# Patient Record
Sex: Male | Born: 2000 | Race: Black or African American | Hispanic: No | Marital: Single | Smoking: Never smoker
Health system: Southern US, Community
[De-identification: ages and names within clinical notes are randomized; demographics above are authoritative.]

## PROBLEM LIST (undated history)

## (undated) ENCOUNTER — Emergency Department (HOSPITAL_COMMUNITY): Payer: Medicaid Other

## (undated) DIAGNOSIS — R41840 Attention and concentration deficit: Secondary | ICD-10-CM

## (undated) DIAGNOSIS — Q600 Renal agenesis, unilateral: Secondary | ICD-10-CM

## (undated) DIAGNOSIS — J302 Other seasonal allergic rhinitis: Secondary | ICD-10-CM

## (undated) DIAGNOSIS — T7840XA Allergy, unspecified, initial encounter: Secondary | ICD-10-CM

## (undated) DIAGNOSIS — R5600 Simple febrile convulsions: Secondary | ICD-10-CM

## (undated) HISTORY — PX: OTHER SURGICAL HISTORY: SHX169

---
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
## 2018-07-21 ENCOUNTER — Telehealth (INDEPENDENT_AMBULATORY_CARE_PROVIDER_SITE_OTHER): Payer: Self-pay | Admitting: "Endocrinology

## 2018-07-21 NOTE — Telephone Encounter (Signed)
°  Who's calling (name and relationship to patient) :mom-Tiana  Best contact number:[phone]  Provider they see: Fransico Michael  Reason for call: in regards to paperwork for lawyer Jacob Moores, needs paperwork sent Asap, mom states it was dropped off and put with siblings paperwork, gave permission for grandma to recive message     PRESCRIPTION REFILL ONLY  Name of prescription:  Pharmacy:

## 2018-07-21 NOTE — Telephone Encounter (Signed)
Routed to [place].  Apparently some paperwork for this patient was put in his brothers chart but I can't find the brother, Apolinar Junes. I'm confused.

## 2018-08-11 ENCOUNTER — Encounter (INDEPENDENT_AMBULATORY_CARE_PROVIDER_SITE_OTHER): Payer: Self-pay | Admitting: "Endocrinology

## 2018-08-25 ENCOUNTER — Telehealth (INDEPENDENT_AMBULATORY_CARE_PROVIDER_SITE_OTHER): Payer: Self-pay | Admitting: "Endocrinology

## 2018-08-25 NOTE — Telephone Encounter (Signed)
°  Who's calling (name and relationship to patient) :  Best contact number:  Provider they see:  Reason for call: Mom called and needs paperwork, his medical record, forwarded to his attorney, would like it faxed [phone], and the number for the attorney is [phone].     PRESCRIPTION REFILL ONLY  Name of prescription:  Pharmacy:

## 2019-12-16 ENCOUNTER — Other Ambulatory Visit: Payer: Self-pay

## 2019-12-16 ENCOUNTER — Encounter (HOSPITAL_COMMUNITY): Payer: Self-pay | Admitting: Emergency Medicine

## 2019-12-16 ENCOUNTER — Emergency Department (HOSPITAL_COMMUNITY)
Admission: EM | Admit: 2019-12-16 | Discharge: 2019-12-17 | Disposition: A | Discharge status: Home / Self Care | Payer: Self-pay | Attending: Emergency Medicine | Admitting: Emergency Medicine

## 2019-12-16 DIAGNOSIS — R45851 Suicidal ideations: Secondary | ICD-10-CM | POA: Insufficient documentation

## 2019-12-16 DIAGNOSIS — F1220 Cannabis dependence, uncomplicated: Secondary | ICD-10-CM | POA: Insufficient documentation

## 2019-12-16 DIAGNOSIS — Z20822 Contact with and (suspected) exposure to covid-19: Secondary | ICD-10-CM | POA: Insufficient documentation

## 2019-12-16 DIAGNOSIS — F1910 Other psychoactive substance abuse, uncomplicated: Secondary | ICD-10-CM

## 2019-12-16 DIAGNOSIS — F1320 Sedative, hypnotic or anxiolytic dependence, uncomplicated: Secondary | ICD-10-CM | POA: Insufficient documentation

## 2019-12-16 DIAGNOSIS — F1994 Other psychoactive substance use, unspecified with psychoactive substance-induced mood disorder: Secondary | ICD-10-CM | POA: Insufficient documentation

## 2019-12-16 NOTE — ED Triage Notes (Signed)
  Patient comes in with possible drug overdose.  Patient states he took some xanax with friends earlier around 1400 and was doing it to feel good.  Mom states patient has been lethargic all day and slurring his speech.  Incontinent episode earlier.  Patient loud and cursing repeatedly in triage.  Patient states he wants to go home but mom wants him checked out.    Patient states he only took 1 green xanax but family thinks it was more than that.  No alcohol per mom.  Marijuana use.

## 2019-12-17 LAB — COMPREHENSIVE METABOLIC PANEL
ALT: 54 U/L — ABNORMAL HIGH (ref 0–44)
AST: 79 U/L — ABNORMAL HIGH (ref 15–41)
Albumin: 4.8 g/dL (ref 3.5–5.0)
Alkaline Phosphatase: 87 U/L (ref 38–126)
Anion gap: 15 (ref 5–15)
BUN: 5 mg/dL — ABNORMAL LOW (ref 6–20)
CO2: 27 mmol/L (ref 22–32)
Calcium: 10.4 mg/dL — ABNORMAL HIGH (ref 8.9–10.3)
Chloride: 108 mmol/L (ref 98–111)
Creatinine, Ser: 1.53 mg/dL — ABNORMAL HIGH (ref 0.61–1.24)
GFR calc Af Amer: >60 mL/min (ref >60)
GFR calc non Af Amer: >60 mL/min (ref >60)
Glucose, Bld: 130 mg/dL — ABNORMAL HIGH (ref 70–99)
Potassium: 6.2 mmol/L — ABNORMAL HIGH (ref 3.5–5.1)
Sodium: 150 mmol/L — ABNORMAL HIGH (ref 135–145)
Total Bilirubin: 1.2 mg/dL (ref 0.3–1.2)
Total Protein: 8 g/dL (ref 6.5–8.1)

## 2019-12-17 LAB — RAPID URINE DRUG SCREEN, HOSP PERFORMED
Amphetamines: NOT DETECTED
Barbiturates: NOT DETECTED
Benzodiazepines: POSITIVE — AB
Cocaine: NOT DETECTED
Opiates: NOT DETECTED
Tetrahydrocannabinol: POSITIVE — AB

## 2019-12-17 LAB — CBC WITH DIFFERENTIAL/PLATELET
Abs Immature Granulocytes: 0.08 10*3/uL — ABNORMAL HIGH (ref 0.00–0.07)
Basophils Absolute: 0.1 10*3/uL (ref 0.0–0.1)
Basophils Relative: 0 %
Eosinophils Absolute: 0.2 10*3/uL (ref 0.0–0.5)
Eosinophils Relative: 1 %
HCT: 53.6 % — ABNORMAL HIGH (ref 39.0–52.0)
Hemoglobin: 16.7 g/dL (ref 13.0–17.0)
Immature Granulocytes: 0 %
Lymphocytes Relative: 11 %
Lymphs Abs: 2 10*3/uL (ref 0.7–4.0)
MCH: 28.5 pg (ref 26.0–34.0)
MCHC: 31.2 g/dL (ref 30.0–36.0)
MCV: 91.6 fL (ref 80.0–100.0)
Monocytes Absolute: 1 10*3/uL (ref 0.1–1.0)
Monocytes Relative: 6 %
Neutro Abs: 14.7 10*3/uL — ABNORMAL HIGH (ref 1.7–7.7)
Neutrophils Relative %: 82 %
Platelets: 295 10*3/uL (ref 150–400)
RBC: 5.85 MIL/uL — ABNORMAL HIGH (ref 4.22–5.81)
RDW: 12.4 % (ref 11.5–15.5)
WBC: 18 10*3/uL — ABNORMAL HIGH (ref 4.0–10.5)
nRBC: 0 % (ref 0.0–0.2)

## 2019-12-17 LAB — SALICYLATE LEVEL: Salicylate Lvl: <7 mg/dL — ABNORMAL LOW (ref 7.0–30.0)

## 2019-12-17 LAB — URINALYSIS, ROUTINE W REFLEX MICROSCOPIC
Bilirubin Urine: NEGATIVE
Glucose, UA: NEGATIVE mg/dL
Hgb urine dipstick: NEGATIVE
Ketones, ur: NEGATIVE mg/dL
Leukocytes,Ua: NEGATIVE
Nitrite: NEGATIVE
Protein, ur: NEGATIVE mg/dL
Specific Gravity, Urine: 1.003 — ABNORMAL LOW (ref 1.005–1.030)
pH: 5 (ref 5.0–8.0)

## 2019-12-17 LAB — POTASSIUM: Potassium: 3.6 mmol/L (ref 3.5–5.1)

## 2019-12-17 LAB — ETHANOL: Alcohol, Ethyl (B): <10 mg/dL (ref <10)

## 2019-12-17 LAB — RESPIRATORY PANEL BY RT PCR (FLU A&B, COVID)
Influenza A by PCR: NEGATIVE
Influenza B by PCR: NEGATIVE
SARS Coronavirus 2 by RT PCR: NEGATIVE

## 2019-12-17 LAB — ACETAMINOPHEN LEVEL: Acetaminophen (Tylenol), Serum: <10 ug/mL — ABNORMAL LOW (ref 10–30)

## 2019-12-17 LAB — CBG MONITORING, ED: Glucose-Capillary: 99 mg/dL (ref 70–99)

## 2019-12-17 MED ORDER — SODIUM CHLORIDE 0.9 % IV BOLUS
1000.0000 mL | Freq: Once | INTRAVENOUS | Status: AC
  Administered 2019-12-17: 1000 mL via INTRAVENOUS

## 2019-12-17 MED ORDER — ZIPRASIDONE MESYLATE 20 MG IM SOLR
20.0000 mg | Freq: Once | INTRAMUSCULAR | Status: AC
  Administered 2019-12-17: 20 mg via INTRAMUSCULAR

## 2019-12-17 MED ORDER — STERILE WATER FOR INJECTION IJ SOLN
10.0000 mL | Freq: Once | INTRAMUSCULAR | Status: AC
  Administered 2019-12-17: 10 mL via INTRAMUSCULAR

## 2019-12-17 MED ORDER — ZIPRASIDONE MESYLATE 20 MG IM SOLR
INTRAMUSCULAR | Status: AC
  Filled 2019-12-17: qty 20

## 2019-12-17 NOTE — Consult Note (Signed)
Telepsych Consultation   Reason for Consult:  Aggressive Behavioral  Referring Physician: EDP  Location of Patient:  50[address] Location of Provider: [facility]  Patient Identification: Ronnie Davis MRN:  [id number] Principal Diagnosis: <principal problem not specified> Diagnosis:  Active Problems:   * No active hospital problems. *   Total Time spent with patient: 15 minutes  Subjective:   Ronnie Davis is a 19 y.o. male patient was seen and evaluated via teleassessment.  He denies suicidal or homicidal ideations.  Denies auditory or visual hallucinations.  Denied that he has been followed by therapist and/or psychiatrist in the past.  Patient admits to using substances occasionally.  Patient declined outpatient resources to help with substance use.  Denied previous inpatient admissions.  Denies that he is taking medications daily for mental health.  Denies family history of mental health.  Patient provided verbal authorization to follow-up with patient's grandmother.  Spoke to Ronnie Davis who denied any safety concerns.  Denies any guns or weapons in the home.  States this is the first she is hard of patient being belligerent and depressed.  Patient was provided with additional outpatient resources.  Support, encouragement and  reassurance was provided.  HPI:  Per admission assessment note: Ronnie Davis is an 19 y.o. male who presents to the ED under IVC initiated by EDP. Per IVC, respondent "Ronnie Davis was brought to the Emergency Department by his mother for evaluation of aggressive and violent behavior. He told her that he took Xanax to "feel good", has told others that he he "has nothing to live for." He is very uncooperative, combative." Pt continues to be unresponsive during the assessment. Pt has his blanket covering his face and does not show his face during the entire assessment. Pt admits he intentionally ingested xanax "because I like how it makes me feel." Pt states he  is not prescribed the medication, but he received it from someone. Pt admits he uses cannabis daily. TTS asked the pt if he is suicidal and he states "fuck no." Pt also denies HI and denies AVH. Pt states he just started to work 2 days ago at a location on [employer] but it is unclear of the location because the pt is mumbled and the blanket covers his face. Pt states "I don't sleep."    Pt states he has pending legal charges and when asked what the charges are pt yells "I don't know. Ask my mom!" TTS informed the pt his mother would be called in order to obtain collateral information with his consent and then pt exclaims "where is my mother?!" Pt states he does not know his mothers telephone number. TTS contacted the telephone number entered in the pt's chart, (Ronnie Davis,Ronnie Davis) [phone] but did not receive an answer. There is no v/m for TTS to leave a message.  Pt is aggressive in the ED. He is alert and oriented to self and place only. Pt appears irritable and uncertain why he is in the ED. Pt keeps the blanket over his face during the assessment. Pt denies prior inpt admission. Pt reports he has received OPT in the past but he does not recall when or why he was being treated  Past Psychiatric History:   Risk to Self: Suicidal Ideation: No Suicidal Intent: No Is patient at risk for suicide?: No Suicidal Plan?: No Access to Means: No What has been your use of drugs/alcohol within the last 12 months?: xanax, cannabis Triggers for Past Attempts: None known Intentional Self Injurious  Behavior: None Risk to Others: Homicidal Ideation: No Thoughts of Harm to Others: No Current Homicidal Intent: No Current Homicidal Plan: No Access to Homicidal Means: No History of harm to others?: Yes Assessment of Violence: On admission Violent Behavior Description: pt aggressive and violent in the ED Does patient have access to weapons?: No Criminal Charges Pending?: Yes Describe Pending Criminal Charges:  (unk) Does patient have a court date: Yes Court Date: (unk) Prior Inpatient Therapy: Prior Inpatient Therapy: No Prior Outpatient Therapy: Prior Outpatient Therapy: Yes Prior Therapy Dates: pt does not recall Prior Therapy Facilty/Provider(s): pt does not recall Reason for Treatment: pt does not recall Does patient have an ACCT team?: No Does patient have Intensive In-House Services?  : No Does patient have [facility] services? : No Does patient have P4CC services?: No  Past Medical History: History reviewed. No pertinent past medical history.  Family History: History reviewed. No pertinent family history. Family Psychiatric  History:  Social History:  Social History   Substance and Sexual Activity  Alcohol Use None     Social History   Substance and Sexual Activity  Drug Use Not on file    Social History   Socioeconomic History  . Marital status: Single    Spouse name: Not on file  . Number of children: Not on file  . Years of education: Not on file  . Highest education level: Not on file  Occupational History  . Not on file  Tobacco Use  . Smoking status: Not on file  Substance and Sexual Activity  . Alcohol use: Not on file  . Drug use: Not on file  . Sexual activity: Not on file  Other Topics Concern  . Not on file  Social History Narrative  . Not on file   Social Determinants of Health   Financial Resource Strain:   . Difficulty of Paying Living Expenses:   Food Insecurity:   . Worried About Programme researcher, broadcasting/film/video in the Last Year:   . Barista in the Last Year:   Transportation Needs:   . Freight forwarder (Medical):   Marland Kitchen Lack of Transportation (Non-Medical):   Physical Activity:   . Days of Exercise per Week:   . Minutes of Exercise per Session:   Stress:   . Feeling of Stress :   Social Connections:   . Frequency of Communication with Friends and Family:   . Frequency of Social Gatherings with Friends and Family:   . Attends Religious  Services:   . Active Member of Clubs or Organizations:   . Attends Banker Meetings:   Marland Kitchen Marital Status:    Additional Social History:    Allergies:  No Known Allergies  Labs:  Results for orders placed or performed during the hospital encounter of 12/16/19 (from the past 48 hour(s))  Comprehensive metabolic panel     Status: Abnormal   Collection Time: 12/17/19 12:17 AM  Result Value Ref Range   Sodium 150 (H) 135 - 145 mmol/L   Potassium 6.2 (H) 3.5 - 5.1 mmol/L   Chloride 108 98 - 111 mmol/L   CO2 27 22 - 32 mmol/L   Glucose, Bld 130 (H) 70 - 99 mg/dL    Comment: Glucose reference range applies only to samples taken after fasting for at least 8 hours.   BUN 5 (L) 6 - 20 mg/dL   Creatinine, Ser 0.93 (H) 0.61 - 1.24 mg/dL   Calcium 23.5 (H) 8.9 - 10.3  mg/dL   Total Protein 8.0 6.5 - 8.1 g/dL   Albumin 4.8 3.5 - 5.0 g/dL   AST 79 (H) 15 - 41 U/L   ALT 54 (H) 0 - 44 U/L   Alkaline Phosphatase 87 38 - 126 U/L   Total Bilirubin 1.2 0.3 - 1.2 mg/dL   GFR calc non Af Amer >60 >60 mL/min   GFR calc Af Amer >60 >60 mL/min   Anion gap 15 5 - 15    Comment: Performed at [facility] [address] [place], [place] [address]  Salicylate level     Status: Abnormal   Collection Time: 12/17/19 12:17 AM  Result Value Ref Range   Salicylate Lvl <6.7 (L) 7.0 - 30.0 mg/dL    Comment: Performed at [facility] [address], [place], [place] [address]  Acetaminophen level     Status: Abnormal   Collection Time: 12/17/19 12:17 AM  Result Value Ref Range   Acetaminophen (Tylenol), Serum <10 (L) 10 - 30 ug/mL    Comment: (NOTE) Therapeutic concentrations vary significantly. A range of 10-30 ug/mL  may be an effective concentration for many patients. However, some  are best treated at concentrations outside of this range. Acetaminophen concentrations >150 ug/mL at 4 hours after ingestion  and >50 ug/mL at 12 hours after ingestion are often associated with   toxic reactions. Performed at [facility], [place] [address] [place], [place] [address]   Ethanol     Status: None   Collection Time: 12/17/19 12:17 AM  Result Value Ref Range   Alcohol, Ethyl (B) <10 <10 mg/dL    Comment: (NOTE) Lowest detectable limit for serum alcohol is 10 mg/dL. For medical purposes only. Performed at [facility], [place] [address] [place], [place] [address]   CBC WITH DIFFERENTIAL     Status: Abnormal   Collection Time: 12/17/19 12:17 AM  Result Value Ref Range   WBC 18.0 (H) 4.0 - 10.5 K/uL   RBC 5.85 (H) 4.22 - 5.81 MIL/uL   Hemoglobin 16.7 13.0 - 17.0 g/dL   HCT 53.6 (H) 39.0 - 52.0 %   MCV 91.6 80.0 - 100.0 fL   MCH 28.5 26.0 - 34.0 pg   MCHC 31.2 30.0 - 36.0 g/dL   RDW 12.4 11.5 - 15.5 %   Platelets 295 150 - 400 K/uL   nRBC 0.0 0.0 - 0.2 %   Neutrophils Relative % 82 %   Neutro Abs 14.7 (H) 1.7 - 7.7 K/uL   Lymphocytes Relative 11 %   Lymphs Abs 2.0 0.7 - 4.0 K/uL   Monocytes Relative 6 %   Monocytes Absolute 1.0 0.1 - 1.0 K/uL   Eosinophils Relative 1 %   Eosinophils Absolute 0.2 0.0 - 0.5 K/uL   Basophils Relative 0 %   Basophils Absolute 0.1 0.0 - 0.1 K/uL   Immature Granulocytes 0 %   Abs Immature Granulocytes 0.08 (H) 0.00 - 0.07 K/uL    Comment: Performed at [facility], [address] [place], [place] [address]  Urinalysis, Routine w reflex microscopic     Status: Abnormal   Collection Time: 12/17/19 12:20 AM  Result Value Ref Range   Color, Urine STRAW (A) YELLOW   APPearance CLEAR CLEAR   Specific Gravity, Urine 1.003 (L) 1.005 - 1.030   pH 5.0 5.0 - 8.0   Glucose, UA NEGATIVE NEGATIVE mg/dL   Hgb urine dipstick NEGATIVE NEGATIVE   Bilirubin Urine  NEGATIVE NEGATIVE   Ketones, ur NEGATIVE NEGATIVE mg/dL   Protein, ur NEGATIVE NEGATIVE mg/dL   Nitrite NEGATIVE NEGATIVE   Leukocytes,Ua NEGATIVE NEGATIVE    Comment: Performed at [facility], [address] [place], [place] [address]  Potassium      Status: None   Collection Time: 12/17/19  1:11 AM  Result Value Ref Range   Potassium 3.6 3.5 - 5.1 mmol/L    Comment: NO VISIBLE HEMOLYSIS Performed at [facility], [address], [place], [place] [address]   CBG monitoring, ED     Status: None   Collection Time: 12/17/19  1:25 AM  Result Value Ref Range   Glucose-Capillary 99 70 - 99 mg/dL    Comment: Glucose reference range applies only to samples taken after fasting for at least 8 hours.  Urine rapid drug screen (hosp performed)     Status: Abnormal   Collection Time: 12/17/19  2:14 AM  Result Value Ref Range   Opiates NONE DETECTED NONE DETECTED   Cocaine NONE DETECTED NONE DETECTED   Benzodiazepines POSITIVE (A) NONE DETECTED   Amphetamines NONE DETECTED NONE DETECTED   Tetrahydrocannabinol POSITIVE (A) NONE DETECTED   Barbiturates NONE DETECTED NONE DETECTED    Comment: (NOTE) DRUG SCREEN FOR MEDICAL PURPOSES ONLY.  IF CONFIRMATION IS NEEDED FOR ANY PURPOSE, NOTIFY LAB WITHIN 5 DAYS. LOWEST DETECTABLE LIMITS FOR URINE DRUG SCREEN Drug Class                     Cutoff (ng/mL) Amphetamine and metabolites    1000 Barbiturate and metabolites    200 Benzodiazepine                 200 Tricyclics and metabolites     300 Opiates and metabolites        300 Cocaine and metabolites        300 THC                            50 Performed at [facility], [address] [place], [place] [address]   Respiratory Panel by RT PCR (Flu A&B, Covid) - Nasopharyngeal Swab     Status: None   Collection Time: 12/17/19  6:00 AM   Specimen: Nasopharyngeal Swab  Result Value Ref Range   SARS Coronavirus 2 by RT PCR NEGATIVE NEGATIVE    Comment: (NOTE) SARS-CoV-2 target nucleic acids are NOT DETECTED. The SARS-CoV-2 RNA is generally detectable in upper respiratoy specimens during the acute phase of infection. The lowest concentration of SARS-CoV-2 viral copies this assay can detect is 131 copies/mL. A negative result does  not preclude SARS-Cov-2 infection and should not be used as the sole basis for treatment or other patient management decisions. A negative result may occur with  improper specimen collection/handling, submission of specimen other than nasopharyngeal swab, presence of viral mutation(s) within the areas targeted by this assay, and inadequate number of viral copies (<131 copies/mL). A negative result must be combined with clinical observations, patient history, and epidemiological information. The expected result is Negative. Fact Sheet for Patients:  https://www.moore.com/ Fact Sheet for Healthcare Providers:  https://www.young.biz/ This test is not yet ap proved or cleared by the [place] FDA and  has been authorized for detection and/or diagnosis of SARS-CoV-2 by FDA under an Emergency Use Authorization (EUA). This EUA will remain  in effect (meaning this test can be used) for  the duration of the COVID-19 declaration under Section 564(b)(1) of the Act, 21 U.S.C. section 360bbb-3(b)(1), unless the authorization is terminated or revoked sooner.    Influenza A by PCR NEGATIVE NEGATIVE   Influenza B by PCR NEGATIVE NEGATIVE    Comment: (NOTE) The Xpert Xpress SARS-CoV-2/FLU/RSV assay is intended as an aid in  the diagnosis of influenza from Nasopharyngeal swab specimens and  should not be used as a sole basis for treatment. Nasal washings and  aspirates are unacceptable for Xpert Xpress SARS-CoV-2/FLU/RSV  testing. Fact Sheet for Patients: https://www.moore.com/https://www.fda.gov/media/142436/download Fact Sheet for Healthcare Providers: https://www.young.biz/https://www.fda.gov/media/142435/download This test is not yet approved or cleared by the [place] FDA and  has been authorized for detection and/or diagnosis of SARS-CoV-2 by  FDA under an Emergency Use Authorization (EUA). This EUA will remain  in effect (meaning this test can be used) for the duration of the  Covid-19  declaration under Section 564(b)(1) of the Act, 21  U.S.C. section 360bbb-3(b)(1), unless the authorization is  terminated or revoked. Performed at [facility], [address] [place], [place] [address]     Medications:  No current facility-administered medications for this encounter.   No current outpatient medications on file.    Musculoskeletal:   Psychiatric Specialty Exam: Physical Exam  Review of Systems  Blood pressure (!) 122/98, pulse 72, temperature 98.5 F (36.9 C), temperature source Oral, resp. rate 16, height 5\' 7"  (1.702 m), weight 50 kg, SpO2 99 %.Body mass index is 17.26 kg/m.  General Appearance: Casual  Eye Contact:  Good  Speech:  Clear and Coherent  Volume:  Normal  Mood:  Anxious and Depressed  Affect:  Congruent  Thought Process:  Coherent  Orientation:  Full (Time, Place, and Person)  Thought Content:  Logical  Suicidal Thoughts:  No  Homicidal Thoughts:  No  Memory:  Immediate;   Fair Recent;   Fair  Judgement:  Fair  Insight:  Fair  Psychomotor Activity:  Normal  Concentration:  Concentration: Fair  Recall:  Fis[employer]Fair  Fund of Knowledge:  Fair  Language:  Fair  Akathisia:  No  Handed:  Right  AIMS (if indicated):     Assets:  Communication Skills Desire for Improvement Resilience Social Support  ADL's:  Intact  Cognition:  WNL  Sleep:      Patient to be cleared by psychiatry CSW to provide additional outpatient resources for substances abuse and depresison  Disposition: No evidence of imminent risk to self or others at present.   Recommend psychiatric Inpatient admission when medically cleared. Supportive therapy provided about ongoing stressors. Refer to IOP. Discussed crisis plan, support from social network, calling 911, coming to the Emergency Department, and calling Suicide Hotline.  This service was provided via telemedicine using a 2-way, interactive audio and video technology.  Names of all persons participating in  this telemedicine service and their role in this encounter. Name: Shea StakesBryson Propst  Role: patient   Name: T.Daylynn Stumpp Role: NP           Oneta Rackanika N Eleri Ruben, NP 12/17/2019 11:03 AM

## 2019-12-17 NOTE — Discharge Instructions (Addendum)
Follow up with mental health as recommended by the psychiatry team, please review the instructions to help arranging for outpatient treatment  Follow up with a primary care doctor for a checkup and to recheck your laboratory tests

## 2019-12-17 NOTE — ED Notes (Signed)
Contacted patient placement for sitter. Informed by Aggie Hacker that a sitter will be available at [facility]

## 2019-12-17 NOTE — Progress Notes (Signed)
TTS was able to review pt's chart and IVC. Spoke with Margarette Canada, RN to advise cart can be placed in pt's room for assessment.

## 2019-12-17 NOTE — B[facility] Counselor (Signed)
Clinician called and spoke to Will, Charity fundraiser. Per Will, RN pt was violent in the front and was given Geodon. Pt is currently sleep. Clinician asked Will, RN to call once pt was alert, oriented and able to engage.    Redmond Pulling, MS, [facility], [facility] Triage Specialist [phone].

## 2019-12-17 NOTE — ED Notes (Addendum)
Pt arrived to Rm 51 via stretcher - pt refused to ambulate to bed - Pt noted to be cursing, verbally abusive, and talking loudly - inappropriate language. Pt noted to be dressed in scrubs. Sitter w/pt.

## 2019-12-17 NOTE — ED Provider Notes (Signed)
[facility] EMERGENCY DEPARTMENT Provider Note   CSN: [id number] Arrival date & time: 12/16/19  2342     History Chief Complaint  Patient presents with  . Drug Overdose    Ronnie [place] is a 19 y.o. male.  Patient is a 19 year old male otherwise healthy brought by mom for evaluation of possible drug ingestion.  Patient has been apparently stumbling around and behaving inappropriately.  His speech has been slurred.  Patient does admit to taking Xanax earlier today so that he would "feel good".  He has told those attempting to take care of him here in the ER that he "has nothing to live for".  Patient is extremely uncooperative and adds little to no history.  He is cursing and swearing and requiring physical and chemical restraint.  The history is provided by the patient and a relative (Mother).       History reviewed. No pertinent past medical history.  There are no problems to display for this patient.        History reviewed. No pertinent family history.  Social History   Tobacco Use  . Smoking status: Not on file  Substance Use Topics  . Alcohol use: Not on file  . Drug use: Not on file    Home Medications Prior to Admission medications   Not on File    Allergies    Patient has no known allergies.  Review of Systems   Review of Systems  Unable to perform ROS: Psychiatric disorder    Physical Exam Updated Vital Signs BP (!) 139/93 (BP Location: Left Arm)   Pulse 92   Temp 98.5 F (36.9 C) (Oral)   Resp 20   Ht 5\' 7"  (1.702 m)   Wt 50 kg   SpO2 96%   BMI 17.26 kg/m   Physical Exam Vitals and nursing note reviewed.  Constitutional:      General: He is not in acute distress.    Appearance: He is well-developed. He is not diaphoretic.     Comments: Patient is a 19 year old male.  He is extremely uncooperative and combative.  He is screaming and cursing and requiring physical and chemical restraint.  HENT:     Head:  Normocephalic and atraumatic.  Cardiovascular:     Rate and Rhythm: Normal rate and regular rhythm.     Heart sounds: No murmur. No friction rub.  Pulmonary:     Effort: Pulmonary effort is normal. No respiratory distress.     Breath sounds: Normal breath sounds. No wheezing or rales.  Abdominal:     General: Bowel sounds are normal. There is no distension.     Palpations: Abdomen is soft.     Tenderness: There is no abdominal tenderness.  Musculoskeletal:        General: Normal range of motion.     Cervical back: Normal range of motion and neck supple.  Skin:    General: Skin is warm and dry.  Neurological:     Mental Status: He is alert and oriented to person, place, and time.     Coordination: Coordination normal.  Psychiatric:        Mood and Affect: Affect is angry.        Behavior: Behavior is agitated, aggressive and combative.        Thought Content: Thought content includes suicidal ideation.     ED Results / Procedures / Treatments   Labs (all labs ordered are listed, but only abnormal results are displayed)  Labs Reviewed  CBC WITH DIFFERENTIAL/PLATELET - Abnormal; Notable for the following components:      Result Value   WBC 18.0 (*)    RBC 5.85 (*)    HCT 53.6 (*)    Neutro Abs 14.7 (*)    Abs Immature Granulocytes 0.08 (*)    All other components within normal limits  COMPREHENSIVE METABOLIC PANEL  SALICYLATE LEVEL  ACETAMINOPHEN LEVEL  ETHANOL  RAPID URINE DRUG SCREEN, HOSP PERFORMED  URINALYSIS, ROUTINE W REFLEX MICROSCOPIC  CBG MONITORING, ED    EKG EKG Interpretation  Date/Time:  Saturday December 17 2019 00:42:14 EDT Ventricular Rate:  89 PR Interval:    QRS Duration: 83 QT Interval:  341 QTC Calculation: 415 R Axis:   129 Text Interpretation: Sinus rhythm Short PR interval Right atrial enlargement Right axis deviation Confirmed by Veryl Speak [phone]) on 12/17/2019 12:47:56 AM   Radiology No results found.  Procedures Procedures  (including critical care time)  Medications Ordered in ED Medications  sodium chloride 0.9 % bolus 1,000 mL (has no administration in time range)  ziprasidone (GEODON) injection 20 mg (20 mg Intramuscular Given 12/17/19 0017)  sterile water (preservative free) injection 10 mL (10 mLs Injection Given 12/17/19 0017)    ED Course  I have reviewed the triage vital signs and the nursing notes.  Pertinent labs & imaging results that were available during my care of the patient were reviewed by me and considered in my medical decision making (see chart for details).    MDM Rules/Calculators/A&P  Patient was brought here by his mother for evaluation of possible substance ingestion.  Patient describes taking a friend's Xanax to try to get high.  There are also reports that he took multiple pills and that he made remarks about having nothing to live for and threatening to harm himself.  Patient arrived here extremely uncooperative and combative.  Patient required both physical and chemical restraint.  He was given Geodon as he was extremely violent in triage.  As this medication took effect, the patient became more somnolent and work-up was able to be initiated.  His laboratory studies show no significant blood or electrolyte disturbance.  His Tylenol and salicylate levels are negative.  Drug screen does show benzodiazepines and marijuana.  Patient in the process of sleeping off the Geodon and what other medications he took prior to coming in.  TTS attempted to speak with him, however he remains either somnolent or uncooperative.  An attempt at formal psychiatric evaluation will be made in the morning.  Care will be signed out to the oncoming provider at shift change.  Final Clinical Impression(s) / ED Diagnoses Final diagnoses:  None    Rx / DC Orders ED Discharge Orders    None       Veryl Speak, MD 12/17/19 [phone]

## 2019-12-17 NOTE — ED Notes (Signed)
IVC paperwork rescinded by Dr Lynelle Doctor - Copy faxed to [facility] - Copy sent to Medical Records - Original placed in folder for [employer].

## 2019-12-17 NOTE — BH Assessment (Signed)
Tele Assessment Note   Patient Name: Ronnie Davis MRN: [id number] Referring Physician: Geoffery Lyons, MD Location of Patient: [facility] Location of Provider: Behavioral Health TTS Department  Ronnie Davis is an 19 y.o. male who presents to the ED under IVC initiated by EDP. Per IVC, respondent "Brittan was brought to the Emergency Department by his mother for evaluation of aggressive and violent behavior. He told her that he took Xanax to "feel good", has told others that he he "has nothing to live for." He is very uncooperative, combative." Pt continues to be unresponsive during the assessment. Pt has his blanket covering his face and does not show his face during the entire assessment. Pt admits he intentionally ingested xanax "because I like how it makes me feel." Pt states he is not prescribed the medication, but he received it from someone. Pt admits he uses cannabis daily. TTS asked the pt if he is suicidal and he states "fuck no." Pt also denies HI and denies AVH. Pt states he just started to work 2 days ago at a location on [employer] but it is unclear of the location because the pt is mumbled and the blanket covers his face. Pt states "I don't sleep."    Pt states he has pending legal charges and when asked what the charges are pt yells "I don't know. Ask my mom!" TTS informed the pt his mother would be called in order to obtain collateral information with his consent and then pt exclaims "where is my mother?!" Pt states he does not know his mothers telephone number. TTS contacted the telephone number entered in the pt's chart, (Davis,Ronnie) [phone] but did not receive an answer. There is no v/m for TTS to leave a message.  Pt is aggressive in the ED. He is alert and oriented to self and place only. Pt appears irritable and uncertain why he is in the ED. Pt keeps the blanket over his face during the assessment. Pt denies prior inpt admission. Pt reports he has received OPT in the past but  he does not recall when or why he was being treated.  Ronnie Murdoch, NP recommends continued observation for safety and stabilization and to be reassessed by psych once he is sober. EDP Geoffery Lyons, MD and Margarette Canada, RN have been advised of the disposition.  Diagnosis: Substance induced mood d/o; Sedative use d/o, severe; Cannabis use d/o, severe  Past Medical History: History reviewed. No pertinent past medical history.   Family History: History reviewed. No pertinent family history.  Social History:  has no history on file for tobacco, alcohol, and drug.  Additional Social History:  Alcohol / Drug Use Pain Medications: See MAR Prescriptions: See MAR Over the Counter: See MAR History of alcohol / drug use?: Yes Substance #1 Name of Substance 1: Xanax 1 - Age of First Use: 18 1 - Amount (size/oz): unk 1 - Frequency: once 1 - Duration: ongoing 1 - Last Use / Amount: 12/17/2019 Substance #2 Name of Substance 2: Marijuana 2 - Age of First Use: teens 2 - Amount (size/oz): excessive 2 - Frequency: daily 2 - Duration: ongoing 2 - Last Use / Amount: 12/16/2019  CIWA: CIWA-Ar BP: (!) 122/98 Pulse Rate: 72 COWS:    Allergies: No Known Allergies  Home Medications: (Not in a hospital admission)   OB/GYN Status:  No LMP for male patient.  General Assessment Data Assessment unable to be completed: Yes Reason for not completing assessment: Clinician called and spoke to Will,  RN. Per Will, RN pt was violent in the front and was given Geodon. Pt is currently sleep. Clinician asked Will, RN to call once pt was alert, oriented and able to engage.  Location of Assessment: [facility] ED TTS Assessment: In system Is this a Tele or Face-to-Face Assessment?: Tele Assessment Is this an Initial Assessment or a Re-assessment for this encounter?: Initial Assessment Patient Accompanied by:: N/A Language Other than English: No Living Arrangements: Other (Comment) What gender do  you identify as?: Male Marital status: Single Pregnancy Status: No Living Arrangements: Other relatives(cousin, mother) Can pt return to current living arrangement?: Yes Admission Status: Involuntary Petitioner: ED Attending Is patient capable of signing voluntary admission?: No Referral Source: Self/Family/Friend Insurance type: none     Crisis Care Plan Living Arrangements: Other relatives(cousin, mother) Name of Psychiatrist: none Name of Therapist: none  Education Status Is patient currently in school?: No Is the patient employed, unemployed or receiving disability?: Employed  Risk to self with the past 6 months Suicidal Ideation: No Has patient been a risk to self within the past 6 months prior to admission? : No Suicidal Intent: No Has patient had any suicidal intent within the past 6 months prior to admission? : No Is patient at risk for suicide?: No Suicidal Plan?: No Has patient had any suicidal plan within the past 6 months prior to admission? : No Access to Means: No What has been your use of drugs/alcohol within the last 12 months?: xanax, cannabis Previous Attempts/Gestures: No Triggers for Past Attempts: None known Intentional Self Injurious Behavior: None Family Suicide History: No Recent stressful life event(s): Other (Comment)(substance abuse) Persecutory voices/beliefs?: No Depression: No Substance abuse history and/or treatment for substance abuse?: Yes Suicide prevention information given to non-admitted patients: Not applicable  Risk to Others within the past 6 months Homicidal Ideation: No Does patient have any lifetime risk of violence toward others beyond the six months prior to admission? : Yes (comment)(pt aggressive in ED) Thoughts of Harm to Others: No Current Homicidal Intent: No Current Homicidal Plan: No Access to Homicidal Means: No History of harm to others?: Yes Assessment of Violence: On admission Violent Behavior Description: pt  aggressive and violent in the ED Does patient have access to weapons?: No Criminal Charges Pending?: Yes Describe Pending Criminal Charges: (unk) Does patient have a court date: Yes Court Date: (unk) Is patient on probation?: Unknown  Psychosis Hallucinations: None noted Delusions: None noted  Mental Status Report Appearance/Hygiene: Disheveled, Bizarre Eye Contact: Poor Motor Activity: Unremarkable Speech: Aggressive, Slurred Level of Consciousness: Alert, Irritable Mood: Angry, Preoccupied Affect: Angry, Irritable Anxiety Level: None Thought Processes: Irrelevant Judgement: Impaired Orientation: Person, Place Obsessive Compulsive Thoughts/Behaviors: None  Cognitive Functioning Concentration: Normal Memory: Recent Intact, Remote Impaired Is patient IDD: No Insight: Poor Impulse Control: Poor Appetite: Good Have you had any weight changes? : No Change Sleep: Decreased Total Hours of Sleep: 4 Vegetative Symptoms: None  ADLScreening [facility] Assessment Services) Patient's cognitive ability adequate to safely complete daily activities?: Yes Patient able to express need for assistance with ADLs?: Yes Independently performs ADLs?: Yes (appropriate for developmental age)  Prior Inpatient Therapy Prior Inpatient Therapy: No  Prior Outpatient Therapy Prior Outpatient Therapy: Yes Prior Therapy Dates: pt does not recall Prior Therapy Facilty/Provider(s): pt does not recall Reason for Treatment: pt does not recall Does patient have an ACCT team?: No Does patient have Intensive In-House Services?  : No Does patient have [facility] services? : No Does patient have P4CC services?: No  ADL Screening (condition at time of admission) Patient's cognitive ability adequate to safely complete daily activities?: Yes Is the patient deaf or have difficulty hearing?: No Does the patient have difficulty seeing, even when wearing glasses/contacts?: No Does the patient have difficulty  concentrating, remembering, or making decisions?: No Patient able to express need for assistance with ADLs?: Yes Does the patient have difficulty dressing or bathing?: No Independently performs ADLs?: Yes (appropriate for developmental age) Does the patient have difficulty walking or climbing stairs?: No Weakness of Legs: None Weakness of Arms/Hands: None  Home Assistive Devices/Equipment Home Assistive Devices/Equipment: None    Abuse/Neglect Assessment (Assessment to be complete while patient is alone) Abuse/Neglect Assessment Can Be Completed: Yes Physical Abuse: Denies Verbal Abuse: Denies Sexual Abuse: Denies Exploitation of patient/patient's resources: Denies Self-Neglect: Denies     Regulatory affairs officer (For Healthcare) Does Patient Have a Medical Advance Directive?: No Would patient like information on creating a medical advance directive?: No - Patient declined          Disposition: Caroline Sauger, NP recommends continued observation for safety and stabilization and to be reassessed by psych once he is sober. EDP Veryl Speak, MD and Wynn Maudlin, RN have been advised of the disposition.  Disposition Initial Assessment Completed for this Encounter: Yes Disposition of Patient: (overnight OBS pending AM psych assessment) Patient refused recommended treatment: No  This service was provided via telemedicine using a 2-way, interactive audio and video technology.  Names of all persons participating in this telemedicine service and their role in this encounter. Name: [facility] Role: Patient  Name: Caroline Sauger, NP Role: [facility] Provider  Name: Lind Covert, LCSW Role: TTS       Lyanne Co 12/17/2019 6:53 AM

## 2019-12-17 NOTE — ED Notes (Signed)
IVC papers - Copy faxed to [facility] - Copy sent to Medical Records - ALL 3 set on clipboard.

## 2019-12-17 NOTE — Progress Notes (Signed)
Gillermo Murdoch, NP recommends continued observation for safety and stabilization and to be reassessed by psych once he is sober. EDP Geoffery Lyons, MD and Margarette Canada, RN have been advised of the disposition.

## 2019-12-17 NOTE — Progress Notes (Signed)
Margarette Canada, RN called on telepsych to say pt is ready to be seen. Advised him TTS is unfamiliar with this pt as he was being reviewed by another counselor and I have not opened this pt's chart prior to Margarette Canada, RN calling, therefore I do not know anything about this pt.

## 2019-12-17 NOTE — ED Notes (Signed)
Telepsych being performed. 

## 2019-12-17 NOTE — ED Notes (Signed)
Pt refused clean scrubs, pt choosing to wear soiled scrub pt states "I don't care I don't want to be messed with right now". RN notified.

## 2019-12-17 NOTE — ED Notes (Addendum)
   Patient very loud and verbally abusive to staff.  Patient standing in corner of the room and threatening to fight staff and states he is gonna come back and shoot [place] tomorrow.  Excessively cursing and using racial slurs to [employer] and other staff.  Mother in room with patient laughing and minimally trying to stop patient from acting out.    [employer] and [facility] security at bedside to restrain patient.  Ivar Drape PA states patient will be made an IVC.  Patient states "im gonna call my boys and they gonna come shoot everyone of yall".  Patient kicking and swinging at staff during lab draw.  IM Geodon given to patient for agitation.  Patient taken to room 21 by security on stretcher.   Note written by Ivonne Andrew RN

## 2019-12-17 NOTE — ED Notes (Signed)
Pt's grandmother, Venetia Maxon, called inquiring about pt - Woke pt - Pt gave verbal permission to advise her of tx plan - waiting for [facility].

## 2019-12-17 NOTE — ED Provider Notes (Addendum)
Pt assessed by psychiatry.  Pt is now calm and cooperative morning.  Felt to be stable for discharge, outpatient treatment.  Recc outpt follow up to recheck renal function labs  Linwood Dibbles, MD 12/17/19 1128    Linwood Dibbles, MD 12/17/19 [phone]

## 2019-12-17 NOTE — ED Notes (Signed)
Pt metel detected by security.

## 2019-12-17 NOTE — ED Notes (Addendum)
Pt now awake - ambulated to bathroom and back to room w/o difficulty. Pt closed door to room. Stood at door - looking through glass - yelling "What the fuck am I doing here?" RN updated pt and advised him of tx plan - BH NP to assess. BH SW to advise [facility] NP. Pt denies SI/HI. States "I just want to go the fuck home!". Offered breakfast tray to pt - declined at this time. TV remote given to pt as requested.

## 2019-12-17 NOTE — ED Notes (Signed)
BH NP advised recommends for pt to be d/c'd. Pt aware.

## 2019-12-17 NOTE — ED Notes (Signed)
ALL Belongings - 1 labeled belongings bag - returned to pt - Pt signed verifying all items present. Pt's mother came to lobby advising is here to pick up pt. D/C instructions given and questions answered to satisfaction along w/resources.

## 2019-12-17 NOTE — Progress Notes (Signed)
CSW faxed outpatient resources for pt to [facility] ED and notified Kriste Basque, [place].    Ruthann Cancer MSW, LCSWA Clincal Social Worker Disposition  [facility] Ph: [phone] Fax: [phone]

## 2021-01-04 DIAGNOSIS — Z20822 Contact with and (suspected) exposure to covid-19: Secondary | ICD-10-CM | POA: Diagnosis not present

## 2022-02-03 ENCOUNTER — Encounter (HOSPITAL_COMMUNITY): Payer: Self-pay | Admitting: Emergency Medicine

## 2022-02-03 ENCOUNTER — Ambulatory Visit (HOSPITAL_COMMUNITY)
Admission: EM | Admit: 2022-02-03 | Discharge: 2022-02-03 | Disposition: A | Discharge status: Home / Self Care | Payer: Medicaid Other | Attending: Physician Assistant | Admitting: Physician Assistant

## 2022-02-03 DIAGNOSIS — N342 Other urethritis: Secondary | ICD-10-CM | POA: Insufficient documentation

## 2022-02-03 DIAGNOSIS — Z113 Encounter for screening for infections with a predominantly sexual mode of transmission: Secondary | ICD-10-CM | POA: Insufficient documentation

## 2022-02-03 LAB — HIV ANTIBODY (ROUTINE TESTING W REFLEX): HIV Screen 4th Generation wRfx: NONREACTIVE

## 2022-02-03 NOTE — ED Triage Notes (Signed)
Pt is present today with c/o penile pain. Pt would like to be tested for STD.

## 2022-02-03 NOTE — ED Provider Notes (Signed)
[facility]    CSN: [id number] Arrival date & time: 02/03/22  1114      History   Chief Complaint Chief Complaint  Patient presents with   Exposure to STD    HPI Ronnie Davis is a 21 y.o. male.   21 year old male presents with wanting to be screened for STIs.  Patient relates that his last episode of intercourse was about a week ago and it was unprotected.  Patient relates this morning he woke up and started having penile irritation on the tip of the penis.  He is concerned about having some type of STI present.  Patient denies any penile discharge, and denies any type of penile ulcerations.  Patient denies fever.   Exposure to STD   Past Medical History:  Diagnosis Date   Allergy    Attention deficit    Congenital single kidney    Febrile seizure (HCC)    Seasonal allergies     Patient Active Problem List   Diagnosis Date Noted   Elevated serum creatinine 11/08/2015   Prediabetes 10/03/2015   Hyperphosphatemia 01/08/2015   Hyperparathyroidism , secondary, non-renal (HCC) 01/08/2015   Vitamin D deficiency disease 01/08/2015   Goiter 01/08/2015   Elevated liver function tests 01/08/2015   Other fatigue 01/08/2015   Hypocalcemia 12/23/2014   Acute kidney injury (HCC) 12/23/2014   Congenital single kidney     Past Surgical History:  Procedure Laterality Date   Correction of esotropia         Home Medications    Prior to Admission medications   Medication Sig Start Date End Date Taking? Authorizing Provider  buPROPion (WELLBUTRIN) 100 MG tablet Take 100 mg by mouth 2 (two) times daily.    [provider]  calcium carbonate (OS-CAL - DOSED IN MG OF ELEMENTAL CALCIUM) 1250 (500 CA) MG tablet Take 1 tablet (1,250 mg total) by mouth 3 (three) times daily. Patient not taking: Reported on 11/07/2015 12/27/14   Erasmo Downer, MD  Methylphenidate HCl ER 54 MG TB24 Take 1 tablet by mouth every morning. Reported on 11/07/2015 11/21/14    [provider]  Multiple Vitamin (MULTIVITAMIN) capsule Take 1 capsule by mouth daily.    [provider]  trimethoprim-polymyxin b (POLYTRIM) ophthalmic solution Place 1 drop into the right eye every 4 (four) hours. Take 7 days Patient not taking: Reported on 11/07/2015 04/30/15   Hollice Gong, MD  Vitamin D, Ergocalciferol, (DRISDOL) 50000 UNITS CAPS capsule Take 1 capsule (50,000 Units total) by mouth every 7 (seven) days. Patient not taking: Reported on 11/07/2015 12/27/14   Erasmo Downer, MD  Vitamin D, Ergocalciferol, (DRISDOL) 50000 units CAPS capsule Take 1 capsule (50,000 Units total) by mouth every 7 (seven) days. 11/07/15    Stall, MD    Family History Family History  Problem Relation Age of Onset   Vitamin D deficiency Maternal Grandmother    Hypertension Maternal Grandmother     Social History Social History   Tobacco Use   Smokeless tobacco: Never  Substance Use Topics   Alcohol use: No   Drug use: Yes     Allergies   Nsaids and Other   Review of Systems Review of Systems  Genitourinary:  Positive for penile pain.    Physical Exam Triage Vital Signs ED Triage Vitals  Enc Vitals Group     BP 02/03/22 1257 107/70     Pulse Rate 02/03/22 1257 (!) 58     Resp 02/03/22 1257  17     Temp 02/03/22 1257 98 F (36.7 C)     Temp src --      SpO2 02/03/22 1257 100 %     Weight --      Height --      Head Circumference --      Peak Flow --      Pain Score 02/03/22 1256 6     Pain Loc --      Pain Edu? --      Excl. in GC? --    No data found.  Updated Vital Signs BP 107/70   Pulse (!) 58   Temp 98 F (36.7 C)   Resp 17   SpO2 100%   Visual Acuity Right Eye Distance:   Left Eye Distance:   Bilateral Distance:    Right Eye Near:   Left Eye Near:    Bilateral Near:     Physical Exam Constitutional:      Appearance: Normal appearance.  Genitourinary:    Penis: Erythema (minimal redness at urethral  opening, no discharge) present.      Testes: Normal.        Right: Tenderness or swelling not present.        Left: Tenderness not present.  Neurological:     Mental Status: He is alert.     UC Treatments / Results  Labs (all labs ordered are listed, but only abnormal results are displayed) Labs Reviewed  RPR  HIV ANTIBODY (ROUTINE TESTING W REFLEX)  CYTOLOGY, (ORAL, ANAL, URETHRAL) ANCILLARY ONLY    EKG   Radiology No results found.  Procedures Procedures (including critical care time)  Medications Ordered in UC Medications - No data to display  Initial Impression / Assessment and Plan / UC Course  I have reviewed the triage vital signs and the nursing notes.  Pertinent labs & imaging results that were available during my care of the patient were reviewed by me and considered in my medical decision making (see chart for details).    Plan: 1.Advised to use condoms to protect protect against STIs. 2.Asked patient to drink more water based fluids and decrease juices for the next couple days. 3.Advised patient to follow-up with PCP or return to urgent care if symptoms fail to improve. 4.HIV, RPR, GC, Chlamydia test pending. Final Clinical Impressions(s) / UC Diagnoses   Final diagnoses:  Routine screening for STI (sexually transmitted infection)  Urethritis   Discharge Instructions   None    ED Prescriptions   None    PDMP not reviewed this encounter.   Ellsworth Lennox, PA-C 02/03/22 1346

## 2022-02-03 NOTE — Discharge Instructions (Addendum)
Advised patient to use condoms to help prevent STIs.

## 2022-02-04 ENCOUNTER — Telehealth (HOSPITAL_COMMUNITY): Payer: Self-pay | Admitting: Emergency Medicine

## 2022-02-04 LAB — RPR: RPR Ser Ql: NONREACTIVE

## 2022-02-04 LAB — CYTOLOGY, (ORAL, ANAL, URETHRAL) ANCILLARY ONLY
Chlamydia: NEGATIVE
Comment: NEGATIVE
Comment: NEGATIVE
Comment: NORMAL
Neisseria Gonorrhea: POSITIVE — AB
Trichomonas: NEGATIVE

## 2022-02-04 NOTE — Telephone Encounter (Signed)
Per protocol, patient will need treatment with IM Rocephin 500mg  for positive Gonorrhea.   Attempted to reach patient x 1, both numbers on file are incorrect (females answered, both states wrong number).   Will send letter.   [facility] notified

## 2022-02-10 ENCOUNTER — Ambulatory Visit (HOSPITAL_COMMUNITY)
Admission: EM | Admit: 2022-02-10 | Discharge: 2022-02-10 | Disposition: A | Discharge status: Home / Self Care | Payer: Medicaid Other | Attending: Family Medicine | Admitting: Family Medicine

## 2022-02-10 DIAGNOSIS — A549 Gonococcal infection, unspecified: Secondary | ICD-10-CM

## 2022-02-10 MED ORDER — CEFTRIAXONE SODIUM 500 MG IJ SOLR
500.0000 mg | Freq: Once | INTRAMUSCULAR | Status: AC
  Administered 2022-02-10: 500 mg via INTRAMUSCULAR

## 2022-02-10 MED ORDER — LIDOCAINE HCL (PF) 1 % IJ SOLN
INTRAMUSCULAR | Status: AC
  Filled 2022-02-10: qty 2

## 2022-02-10 MED ORDER — CEFTRIAXONE SODIUM 500 MG IJ SOLR
INTRAMUSCULAR | Status: AC
  Filled 2022-02-10: qty 500

## 2022-02-10 NOTE — ED Triage Notes (Signed)
Pt is present today for STD treatment  

## 2022-10-08 ENCOUNTER — Telehealth: Payer: Self-pay

## 2022-10-08 NOTE — Telephone Encounter (Signed)
Mychart msg sent. AS, CMA 

## 2023-05-26 ENCOUNTER — Encounter (HOSPITAL_COMMUNITY): Payer: Self-pay

## 2023-05-26 ENCOUNTER — Ambulatory Visit (HOSPITAL_COMMUNITY)
Admission: EM | Admit: 2023-05-26 | Discharge: 2023-05-26 | Disposition: A | Discharge status: Home / Self Care | Payer: Medicaid Other | Attending: Internal Medicine | Admitting: Internal Medicine

## 2023-05-26 DIAGNOSIS — R059 Cough, unspecified: Secondary | ICD-10-CM | POA: Diagnosis present

## 2023-05-26 DIAGNOSIS — R519 Headache, unspecified: Secondary | ICD-10-CM | POA: Insufficient documentation

## 2023-05-26 DIAGNOSIS — R6889 Other general symptoms and signs: Secondary | ICD-10-CM | POA: Diagnosis not present

## 2023-05-26 DIAGNOSIS — Z1152 Encounter for screening for COVID-19: Secondary | ICD-10-CM | POA: Insufficient documentation

## 2023-05-26 MED ORDER — BENZONATATE 200 MG PO CAPS: 200.0000 mg | ORAL_CAPSULE | Freq: Three times a day (TID) | ORAL | 0 refills | Status: DC | PRN

## 2023-05-26 MED ORDER — LORATADINE 10 MG PO TABS: 10.0000 mg | ORAL_TABLET | Freq: Every day | ORAL | 0 refills | Status: DC

## 2023-05-26 NOTE — ED Triage Notes (Signed)
Pt c/o nasal congestion, cough, headache, chills, and sweaty x2 days. States took ibuprofen yesterday.

## 2023-05-26 NOTE — Discharge Instructions (Signed)
Stay quarantined for 5 days if your covid test is positive, then wear a mask for 5 more days We will call you if the covid test is positive

## 2023-05-26 NOTE — ED Provider Notes (Signed)
[facility]    CSN: [id number] Arrival date & time: 05/26/23  1704      History   Chief Complaint Chief Complaint  Patient presents with   Nasal Congestion    HPI Ronnie Davis is a 22 y.o. male who presents with HA and rhinitis, and hot and cold sensation 2 days ago. Has hx  of seasonal allergies in the Fall and is not taking it.  Has had normal energy and normal appetite. His HA resolved.Has been taking Motrin which helped.     Past Medical History:  Diagnosis Date   Allergy    Attention deficit    Congenital single kidney    Febrile seizure (HCC)    Seasonal allergies     Patient Active Problem List   Diagnosis Date Noted   Elevated serum creatinine 11/08/2015   Prediabetes 10/03/2015   Hyperphosphatemia 01/08/2015   Hyperparathyroidism , secondary, non-renal (HCC) 01/08/2015   Vitamin D deficiency disease 01/08/2015   Goiter 01/08/2015   Elevated liver function tests 01/08/2015   Other fatigue 01/08/2015   Hypocalcemia 12/23/2014   Acute kidney injury (HCC) 12/23/2014   Congenital single kidney     Past Surgical History:  Procedure Laterality Date   Correction of esotropia         Home Medications    Prior to Admission medications   Medication Sig Start Date End Date Taking? Authorizing Provider  benzonatate (TESSALON) 200 MG capsule Take 1 capsule (200 mg total) by mouth 3 (three) times daily as needed for cough. 05/26/23  Yes Rodriguez-Southworth, Nettie Elm, PA-C  loratadine (CLARITIN) 10 MG tablet Take 1 tablet (10 mg total) by mouth daily. 05/26/23  Yes Rodriguez-Southworth, Nettie Elm, PA-C  buPROPion (WELLBUTRIN) 100 MG tablet Take 100 mg by mouth 2 (two) times daily.    [provider]  Multiple Vitamin (MULTIVITAMIN) capsule Take 1 capsule by mouth daily.    [provider]  Vitamin D, Ergocalciferol, (DRISDOL) 50000 units CAPS capsule Take 1 capsule (50,000 Units total) by mouth every 7 (seven) days. 11/07/15   David Stall, MD    Family History Family History  Problem Relation Age of Onset   Vitamin D deficiency Maternal Grandmother    Hypertension Maternal Grandmother     Social History Social History   Tobacco Use   Smoking status: Never   Smokeless tobacco: Never  Substance Use Topics   Alcohol use: No   Drug use: Yes     Allergies   Nsaids and Other   Review of Systems Review of Systems As noted in HPI  Physical Exam Triage Vital Signs ED Triage Vitals  Encounter Vitals Group     BP 05/26/23 1833 121/88     Systolic BP Percentile --      Diastolic BP Percentile --      Pulse Rate 05/26/23 1833 73     Resp 05/26/23 1833 18     Temp 05/26/23 1833 98.4 F (36.9 C)     Temp Source 05/26/23 1833 Oral     SpO2 05/26/23 1833 98 %     Weight --      Height --      Head Circumference --      Peak Flow --      Pain Score 05/26/23 1834 4     Pain Loc --      Pain Education --      Exclude from Growth Chart --    No data found.  Updated Vital Signs BP 121/88 (BP Location: Left Arm)   Pulse 73   Temp 98.4 F (36.9 C) (Oral)   Resp 18   SpO2 98%   Visual Acuity Right Eye Distance:   Left Eye Distance:   Bilateral Distance:    Right Eye Near:   Left Eye Near:    Bilateral Near:     Physical Exam  Physical Exam Vitals signs and nursing note reviewed.  Constitutional:      General: he is not in acute distress.    Appearance: Normal appearance. He is not ill-appearing, toxic-appearing or diaphoretic.  HENT:     Head: Normocephalic.     Right Ear: Tympanic membrane, ear canal and external ear normal.     Left Ear: Tympanic membrane, ear canal and external ear normal.     Nose: Nose normal.     Mouth/Throat: clear    Mouth: Mucous membranes are moist.  Eyes:     General: No scleral icterus.       Right eye: No discharge.        Left eye: No discharge.     Conjunctiva/sclera: Conjunctivae normal.  Neck:     Musculoskeletal: Neck supple. No neck  rigidity.  Cardiovascular:     Rate and Rhythm: Normal rate and regular rhythm.     Heart sounds: No murmur.  Pulmonary:     Effort: Pulmonary effort is normal.     Breath sounds: Normal breath sounds.  Musculoskeletal: Normal range of motion.  Lymphadenopathy:     Cervical: No cervical adenopathy.  Skin:    General: Skin is warm and dry.     Coloration: Skin is not jaundiced.     Findings: No rash.  Neurological:     Mental Status: he is alert and oriented to person, place, and time.     Gait: Gait normal.  Psychiatric:        Mood and Affect: Mood normal.        Behavior: Behavior normal.        Thought Content: Thought content normal.        Judgment: Judgment normal.   UC Treatments / Results  Labs (all labs ordered are listed, but only abnormal results are displayed) Labs Reviewed  SARS CORONAVIRUS 2 (TAT 6-24 HRS)    EKG   Radiology No results found.  Procedures Procedures (including critical care time)  Medications Ordered in UC Medications - No data to display  Initial Impression / Assessment and Plan / UC Course  I have reviewed the triage vital signs and the nursing notes.  Flu like illness Hx of Fall allergies  I refilled his allergy medication and placed him on Claritin Covid test is pending and we will call him if positive.      Final Clinical Impressions(s) / UC Diagnoses   Final diagnoses:  Flu-like symptoms     Discharge Instructions      Stay quarantined for 5 days if your covid test is positive, then wear a mask for 5 more days We will call you if the covid test is positive      ED Prescriptions     Medication Sig Dispense Auth. Provider   loratadine (CLARITIN) 10 MG tablet Take 1 tablet (10 mg total) by mouth daily. 30 tablet Rodriguez-Southworth, Jezreel Sisk, PA-C   benzonatate (TESSALON) 200 MG capsule Take 1 capsule (200 mg total) by mouth 3 (three) times daily as needed for cough. 30 capsule Rodriguez-Southworth, Nettie Elm, [place]  PDMP not reviewed this encounter.   Garey Ham, Cordelia Poche 05/26/23 1939

## 2023-05-27 LAB — SARS CORONAVIRUS 2 (TAT 6-24 HRS): SARS Coronavirus 2: NEGATIVE

## 2024-07-15 ENCOUNTER — Ambulatory Visit (HOSPITAL_COMMUNITY)
Admission: EM | Admit: 2024-07-15 | Discharge: 2024-07-16 | Disposition: A | Discharge status: Home / Self Care | Attending: Psychiatry | Admitting: Psychiatry

## 2024-07-15 DIAGNOSIS — Z046 Encounter for general psychiatric examination, requested by authority: Secondary | ICD-10-CM

## 2024-07-15 DIAGNOSIS — R4689 Other symptoms and signs involving appearance and behavior: Secondary | ICD-10-CM

## 2024-07-15 DIAGNOSIS — R454 Irritability and anger: Secondary | ICD-10-CM | POA: Insufficient documentation

## 2024-07-15 DIAGNOSIS — F918 Other conduct disorders: Secondary | ICD-10-CM | POA: Insufficient documentation

## 2024-07-15 LAB — CBC WITH DIFFERENTIAL/PLATELET
Abs Immature Granulocytes: 0.03 K/uL (ref 0.00–0.07)
Basophils Absolute: 0.1 K/uL (ref 0.0–0.1)
Basophils Relative: 1 %
Eosinophils Absolute: 0.2 K/uL (ref 0.0–0.5)
Eosinophils Relative: 2 %
HCT: 48.3 % (ref 39.0–52.0)
Hemoglobin: 15.7 g/dL (ref 13.0–17.0)
Immature Granulocytes: 0 %
Lymphocytes Relative: 21 %
Lymphs Abs: 2.1 K/uL (ref 0.7–4.0)
MCH: 28.2 pg (ref 26.0–34.0)
MCHC: 32.5 g/dL (ref 30.0–36.0)
MCV: 86.9 fL (ref 80.0–100.0)
Monocytes Absolute: 0.7 K/uL (ref 0.1–1.0)
Monocytes Relative: 7 %
Neutro Abs: 7.2 K/uL (ref 1.7–7.7)
Neutrophils Relative %: 69 %
Platelets: 206 K/uL (ref 150–400)
RBC: 5.56 MIL/uL (ref 4.22–5.81)
RDW: 12.9 % (ref 11.5–15.5)
WBC: 10.2 K/uL (ref 4.0–10.5)
nRBC: 0 % (ref 0.0–0.2)

## 2024-07-15 LAB — LIPID PANEL
Cholesterol: 234 mg/dL — ABNORMAL HIGH (ref 0–200)
HDL: 80 mg/dL (ref >40)
LDL Cholesterol: 139 mg/dL — ABNORMAL HIGH (ref 0–99)
Total CHOL/HDL Ratio: 2.9 ratio
Triglycerides: 76 mg/dL (ref <150)
VLDL: 15 mg/dL (ref 0–40)

## 2024-07-15 LAB — COMPREHENSIVE METABOLIC PANEL WITH GFR
ALT: 131 U/L — ABNORMAL HIGH (ref 0–44)
AST: 70 U/L — ABNORMAL HIGH (ref 15–41)
Albumin: 4.8 g/dL (ref 3.5–5.0)
Alkaline Phosphatase: 129 U/L — ABNORMAL HIGH (ref 38–126)
Anion gap: 17 — ABNORMAL HIGH (ref 5–15)
BUN: 10 mg/dL (ref 6–20)
CO2: 27 mmol/L (ref 22–32)
Calcium: 10 mg/dL (ref 8.9–10.3)
Chloride: 95 mmol/L — ABNORMAL LOW (ref 98–111)
Creatinine, Ser: 1.4 mg/dL — ABNORMAL HIGH (ref 0.61–1.24)
GFR, Estimated: >60 mL/min (ref >60)
Glucose, Bld: 69 mg/dL — ABNORMAL LOW (ref 70–99)
Potassium: 4 mmol/L (ref 3.5–5.1)
Sodium: 139 mmol/L (ref 135–145)
Total Bilirubin: 2.1 mg/dL — ABNORMAL HIGH (ref 0.0–1.2)
Total Protein: 8.1 g/dL (ref 6.5–8.1)

## 2024-07-15 LAB — POCT URINE DRUG SCREEN - MANUAL ENTRY (I-SCREEN)
POC Amphetamine UR: NOT DETECTED
POC Buprenorphine (BUP): NOT DETECTED
POC Cocaine UR: NOT DETECTED
POC Marijuana UR: POSITIVE — AB
POC Methadone UR: NOT DETECTED
POC Methamphetamine UR: NOT DETECTED
POC Morphine: NOT DETECTED — AB
POC Oxazepam (BZO): NOT DETECTED
POC Oxycodone UR: NOT DETECTED
POC Secobarbital (BAR): NOT DETECTED

## 2024-07-15 LAB — ETHANOL: Alcohol, Ethyl (B): <15 mg/dL (ref <15)

## 2024-07-15 LAB — TSH: TSH: 0.734 u[IU]/mL (ref 0.350–4.500)

## 2024-07-15 LAB — HEMOGLOBIN A1C
Hgb A1c MFr Bld: 5.7 % — ABNORMAL HIGH (ref 4.8–5.6)
Mean Plasma Glucose: 116.89 mg/dL

## 2024-07-15 MED ORDER — DIPHENHYDRAMINE HCL 50 MG PO CAPS: 50.0000 mg | ORAL_CAPSULE | Freq: Three times a day (TID) | ORAL | Status: DC | PRN

## 2024-07-15 MED ORDER — HALOPERIDOL LACTATE 5 MG/ML IJ SOLN: 10.0000 mg | Freq: Three times a day (TID) | INTRAMUSCULAR | Status: DC | PRN

## 2024-07-15 MED ORDER — DIPHENHYDRAMINE HCL 50 MG/ML IJ SOLN: 50.0000 mg | Freq: Three times a day (TID) | INTRAMUSCULAR | Status: DC | PRN

## 2024-07-15 MED ORDER — ALUM & MAG HYDROXIDE-SIMETH 200-200-20 MG/5ML PO SUSP: 30.0000 mL | ORAL | Status: DC | PRN

## 2024-07-15 MED ORDER — HYDROXYZINE HCL 25 MG PO TABS: 25.0000 mg | ORAL_TABLET | Freq: Three times a day (TID) | ORAL | Status: DC | PRN

## 2024-07-15 MED ORDER — HALOPERIDOL LACTATE 5 MG/ML IJ SOLN: 5.0000 mg | Freq: Three times a day (TID) | INTRAMUSCULAR | Status: DC | PRN

## 2024-07-15 MED ORDER — LORAZEPAM 2 MG/ML IJ SOLN: 2.0000 mg | Freq: Three times a day (TID) | INTRAMUSCULAR | Status: DC | PRN

## 2024-07-15 MED ORDER — HALOPERIDOL 5 MG PO TABS: 5.0000 mg | ORAL_TABLET | Freq: Three times a day (TID) | ORAL | Status: DC | PRN

## 2024-07-15 MED ORDER — TRAZODONE HCL 50 MG PO TABS: 50.0000 mg | ORAL_TABLET | Freq: Every evening | ORAL | Status: DC | PRN

## 2024-07-15 MED ORDER — ACETAMINOPHEN 325 MG PO TABS
650.0000 mg | ORAL_TABLET | Freq: Four times a day (QID) | ORAL | Status: DC | PRN
  Administered 2024-07-15: 650 mg via ORAL
  Filled 2024-07-15: qty 2

## 2024-07-15 MED ORDER — MAGNESIUM HYDROXIDE 400 MG/5ML PO SUSP: 30.0000 mL | Freq: Every day | ORAL | Status: DC | PRN
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
## 2024-07-16 NOTE — ED Notes (Signed)
 Pt A&O x 4, resting at present, watching TV, no distress noted.  Monitoring for safety.

## 2024-07-16 NOTE — ED Notes (Signed)
 Pt sleeping at present, no distress noted.  Monitoring for safety.

## 2024-07-16 NOTE — Discharge Instructions (Addendum)
 Discharge Recommendations:   Outpatient Follow up: Please review list of outpatient resources for psychiatry and counseling. Please follow up with your primary care provider for all medical related needs.    Therapy: We recommend that patient participate in individual therapy to address mental health concerns.  Safety:   The following safety precautions should be taken:   No sharp objects. This includes scissors, razors, scrapers, and putty knives.   Chemicals should be removed and locked up.   Medications should be removed and locked up.   Weapons should be removed and locked up. This includes firearms, knives and instruments that can be used to cause injury.   The patient should abstain from use of illicit substances/drugs and abuse of any medications.  If symptoms worsen or do not continue to improve or if the patient becomes actively suicidal or homicidal then it is recommended that the patient return to the closest hospital emergency department, the [facility], or call 911 for further evaluation and treatment. National Suicide Prevention Lifeline 1-800-SUICIDE or 847-781-0664.  About 988 988 offers 24/7 access to trained crisis counselors who can help people experiencing mental health-related distress. People can call or text 988 or chat 988lifeline.org for themselves or if they are worried about a loved one who may need crisis support.
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
## 2024-07-16 NOTE — ED Notes (Signed)
 Pt sleeping at present, no distress noted. Respirations even &I Unlabored.  Monitoring for safety.
